# Patient Record
Sex: Female | Born: 1965 | Race: White | Hispanic: No | Marital: Married | Smoking: Current every day smoker
Health system: Southern US, Community
[De-identification: ages and names within clinical notes are randomized; demographics above are authoritative.]

## PROBLEM LIST (undated history)

## (undated) DIAGNOSIS — E079 Disorder of thyroid, unspecified: Secondary | ICD-10-CM

## (undated) DIAGNOSIS — F419 Anxiety disorder, unspecified: Secondary | ICD-10-CM

## (undated) HISTORY — PX: TONSILLECTOMY: SUR1361

## (undated) HISTORY — PX: NASAL SINUS SURGERY: SHX719

---

## 2014-12-02 ENCOUNTER — Telehealth: Payer: Self-pay | Admitting: *Deleted

## 2014-12-02 NOTE — Telephone Encounter (Signed)
error 

## 2016-01-01 ENCOUNTER — Ambulatory Visit
Admission: EM | Admit: 2016-01-01 | Discharge: 2016-01-01 | Disposition: A | Discharge status: Home / Self Care | Payer: BLUE CROSS/BLUE SHIELD | Attending: Family Medicine | Admitting: Family Medicine

## 2016-01-01 ENCOUNTER — Encounter: Payer: Self-pay | Admitting: Gynecology

## 2016-01-01 DIAGNOSIS — J189 Pneumonia, unspecified organism: Secondary | ICD-10-CM

## 2016-01-01 HISTORY — DX: Anxiety disorder, unspecified: F41.9

## 2016-01-01 HISTORY — DX: Disorder of thyroid, unspecified: E07.9

## 2016-01-01 LAB — RAPID INFLUENZA A&B ANTIGENS
Influenza A (ARMC): NOT DETECTED
Influenza B (ARMC): NOT DETECTED

## 2016-01-01 MED ORDER — HYDROCOD POLST-CPM POLST ER 10-8 MG/5ML PO SUER: 5.0000 mL | Freq: Every evening | ORAL | Status: AC | PRN

## 2016-01-01 MED ORDER — LEVOFLOXACIN 750 MG PO TABS: 750.0000 mg | ORAL_TABLET | Freq: Every day | ORAL | Status: AC

## 2016-01-01 MED ORDER — ALBUTEROL SULFATE HFA 108 (90 BASE) MCG/ACT IN AERS: 1.0000 | INHALATION_SPRAY | Freq: Four times a day (QID) | RESPIRATORY_TRACT | Status: AC | PRN

## 2016-01-01 NOTE — ED Provider Notes (Signed)
CSN: [id number]     Arrival date & time 01/01/16  1406 History   None    Chief Complaint  Patient presents with  . Influenza   (Consider location/radiation/quality/duration/timing/severity/associated sxs/prior Treatment) Patient is a 50 y.o. female presenting with URI. The history is provided by the patient.  URI Presenting symptoms: congestion, cough, fatigue and fever   Severity:  Moderate Onset quality:  Sudden Duration:  4 days Timing:  Constant Progression:  Worsening Chronicity:  New Relieved by:  Nothing Ineffective treatments:  OTC medications Associated symptoms: headaches   Associated symptoms: no myalgias, no neck pain, no sinus pain, no sneezing and no wheezing   Risk factors: sick contacts (states mom has pneumonia)   Risk factors: not elderly, no chronic cardiac disease, no chronic kidney disease, no chronic respiratory disease, no diabetes mellitus, no immunosuppression, no recent illness and no recent travel     Past Medical History  Diagnosis Date  . Thyroid disease   . Anxiety    Past Surgical History  Procedure Laterality Date  . Tonsillectomy    . Nasal sinus surgery     No family history on file. Social History  Substance Use Topics  . Smoking status: Current Every Day Smoker -- 0.50 packs/day  . Smokeless tobacco: None  . Alcohol Use: Yes   OB History    No data available     Review of Systems  Constitutional: Positive for fever and fatigue.  HENT: Positive for congestion. Negative for sneezing.   Respiratory: Positive for cough. Negative for wheezing.   Musculoskeletal: Negative for myalgias and neck pain.  Neurological: Positive for headaches.    Allergies  Sulfa antibiotics  Home Medications   Prior to Admission medications   Medication Sig Start Date End Date Taking? Authorizing Provider  citalopram (CELEXA) 20 MG tablet Take 20 mg by mouth daily.   Yes Historical Provider, MD  levothyroxine (SYNTHROID, LEVOTHROID) 75 MCG tablet  Take 75 mcg by mouth daily before breakfast.   Yes Historical Provider, MD  levothyroxine (SYNTHROID, LEVOTHROID) 88 MCG tablet Take 88 mcg by mouth daily before breakfast.   Yes Historical Provider, MD  LORazepam (ATIVAN) 0.5 MG tablet Take 0.5 mg by mouth every 8 (eight) hours.   Yes Historical Provider, MD  albuterol (PROVENTIL HFA;VENTOLIN HFA) 108 (90 Base) MCG/ACT inhaler Inhale 1-2 puffs into the lungs every 6 (six) hours as needed for wheezing or shortness of breath. 01/01/16   Payton Mccallum, MD  chlorpheniramine-HYDROcodone (TUSSIONEX PENNKINETIC ER) 10-8 MG/5ML SUER Take 5 mLs by mouth at bedtime as needed for cough. 01/01/16   Payton Mccallum, MD  levofloxacin (LEVAQUIN) 750 MG tablet Take 1 tablet (750 mg total) by mouth daily. 01/01/16   Payton Mccallum, MD   Meds Ordered and Administered this Visit  Medications - No data to display  BP 131/68 mmHg  Pulse 88  Temp(Src) 99.9 F (37.7 C) (Oral)  Resp 16  Ht  (1.753 m)  Wt 160 lb (72.576 kg)  BMI 23.62 kg/m2  SpO2 98%  LMP 12/18/2015 No data found.   Physical Exam  Constitutional: She appears well-developed and well-nourished. No distress.  HENT:  Head: Normocephalic and atraumatic.  Right Ear: Tympanic membrane, external ear and ear canal normal.  Left Ear: Tympanic membrane, external ear and ear canal normal.  Nose: Mucosal edema and rhinorrhea present. No nose lacerations, sinus tenderness, nasal deformity, septal deviation or nasal septal hematoma. No epistaxis.  No foreign bodies.  Mouth/Throat: Uvula is midline and  mucous membranes are normal. Posterior oropharyngeal erythema present. No oropharyngeal exudate.  Eyes: Conjunctivae and EOM are normal. Pupils are equal, round, and reactive to light. Right eye exhibits no discharge. Left eye exhibits no discharge. No scleral icterus.  Neck: Normal range of motion. Neck supple. No thyromegaly present.  Cardiovascular: Normal rate, regular rhythm and normal heart sounds.    Pulmonary/Chest: Effort normal. No respiratory distress. She has no wheezes. She has rales (left base).  Lymphadenopathy:    She has no cervical adenopathy.  Neurological: She is alert.  Skin: She is not diaphoretic.  Nursing note and vitals reviewed.   ED Course  Procedures (including critical care time)  Labs Review Labs Reviewed  RAPID INFLUENZA A&B ANTIGENS [facility] ONLY)    Imaging Review No results found.   Visual Acuity Review  Right Eye Distance:   Left Eye Distance:   Bilateral Distance:    Right Eye Near:   Left Eye Near:    Bilateral Near:         MDM   1. Community acquired pneumonia    Discharge Medication List as of 01/01/2016  5:07 PM    START taking these medications   Details  albuterol (PROVENTIL HFA;VENTOLIN HFA) 108 (90 Base) MCG/ACT inhaler Inhale 1-2 puffs into the lungs every 6 (six) hours as needed for wheezing or shortness of breath., Starting 01/01/2016, Until Discontinued, Print    chlorpheniramine-HYDROcodone (TUSSIONEX PENNKINETIC ER) 10-8 MG/5ML SUER Take 5 mLs by mouth at bedtime as needed for cough., Starting 01/01/2016, Until Discontinued, Normal    levofloxacin (LEVAQUIN) 750 MG tablet Take 1 tablet (750 mg total) by mouth daily., Starting 01/01/2016, Until Discontinued, Print       1. Lab results and diagnosis reviewed with patient 2. rx as per orders above; reviewed possible side effects, interactions, risks and benefits  3. Recommend supportive treatment with rest, increased fluids, otc analgesics 4. Follow-up prn if symptoms worsen or don't improve    Payton Mccallum, MD 01/01/16 1900

## 2016-01-01 NOTE — ED Notes (Signed)
Patient c/o fever at home x 4 days of 103.1. Patient stated body ace and headache.

## 2016-01-22 ENCOUNTER — Ambulatory Visit
Admission: RE | Admit: 2016-01-22 | Discharge: 2016-01-22 | Disposition: A | Discharge status: Home / Self Care | Payer: BLUE CROSS/BLUE SHIELD | Source: Ambulatory Visit | Attending: Family Medicine | Admitting: Family Medicine

## 2016-01-22 ENCOUNTER — Other Ambulatory Visit: Payer: Self-pay | Admitting: Family Medicine

## 2016-01-22 DIAGNOSIS — R05 Cough: Secondary | ICD-10-CM

## 2016-01-22 DIAGNOSIS — R918 Other nonspecific abnormal finding of lung field: Secondary | ICD-10-CM | POA: Insufficient documentation

## 2016-01-22 DIAGNOSIS — R059 Cough, unspecified: Secondary | ICD-10-CM

## 2016-02-10 ENCOUNTER — Ambulatory Visit
Admission: RE | Admit: 2016-02-10 | Discharge: 2016-02-10 | Disposition: A | Discharge status: Home / Self Care | Payer: BLUE CROSS/BLUE SHIELD | Source: Ambulatory Visit | Attending: Family Medicine | Admitting: Family Medicine

## 2016-02-10 ENCOUNTER — Other Ambulatory Visit: Payer: Self-pay | Admitting: Family Medicine

## 2016-02-10 DIAGNOSIS — Z09 Encounter for follow-up examination after completed treatment for conditions other than malignant neoplasm: Secondary | ICD-10-CM | POA: Diagnosis present

## 2016-02-10 DIAGNOSIS — J189 Pneumonia, unspecified organism: Secondary | ICD-10-CM | POA: Diagnosis present

## 2016-02-10 DIAGNOSIS — Z8701 Personal history of pneumonia (recurrent): Secondary | ICD-10-CM | POA: Insufficient documentation

## 2017-05-27 IMAGING — CR DG CHEST 2V
3 series · 3 of 3 positions shown · non-contrast
Comparison: 01/22/2016

CLINICAL DATA: Followup chest x-ray from 1 month ago

EXAM:
CHEST  2 VIEW

[chest pa]
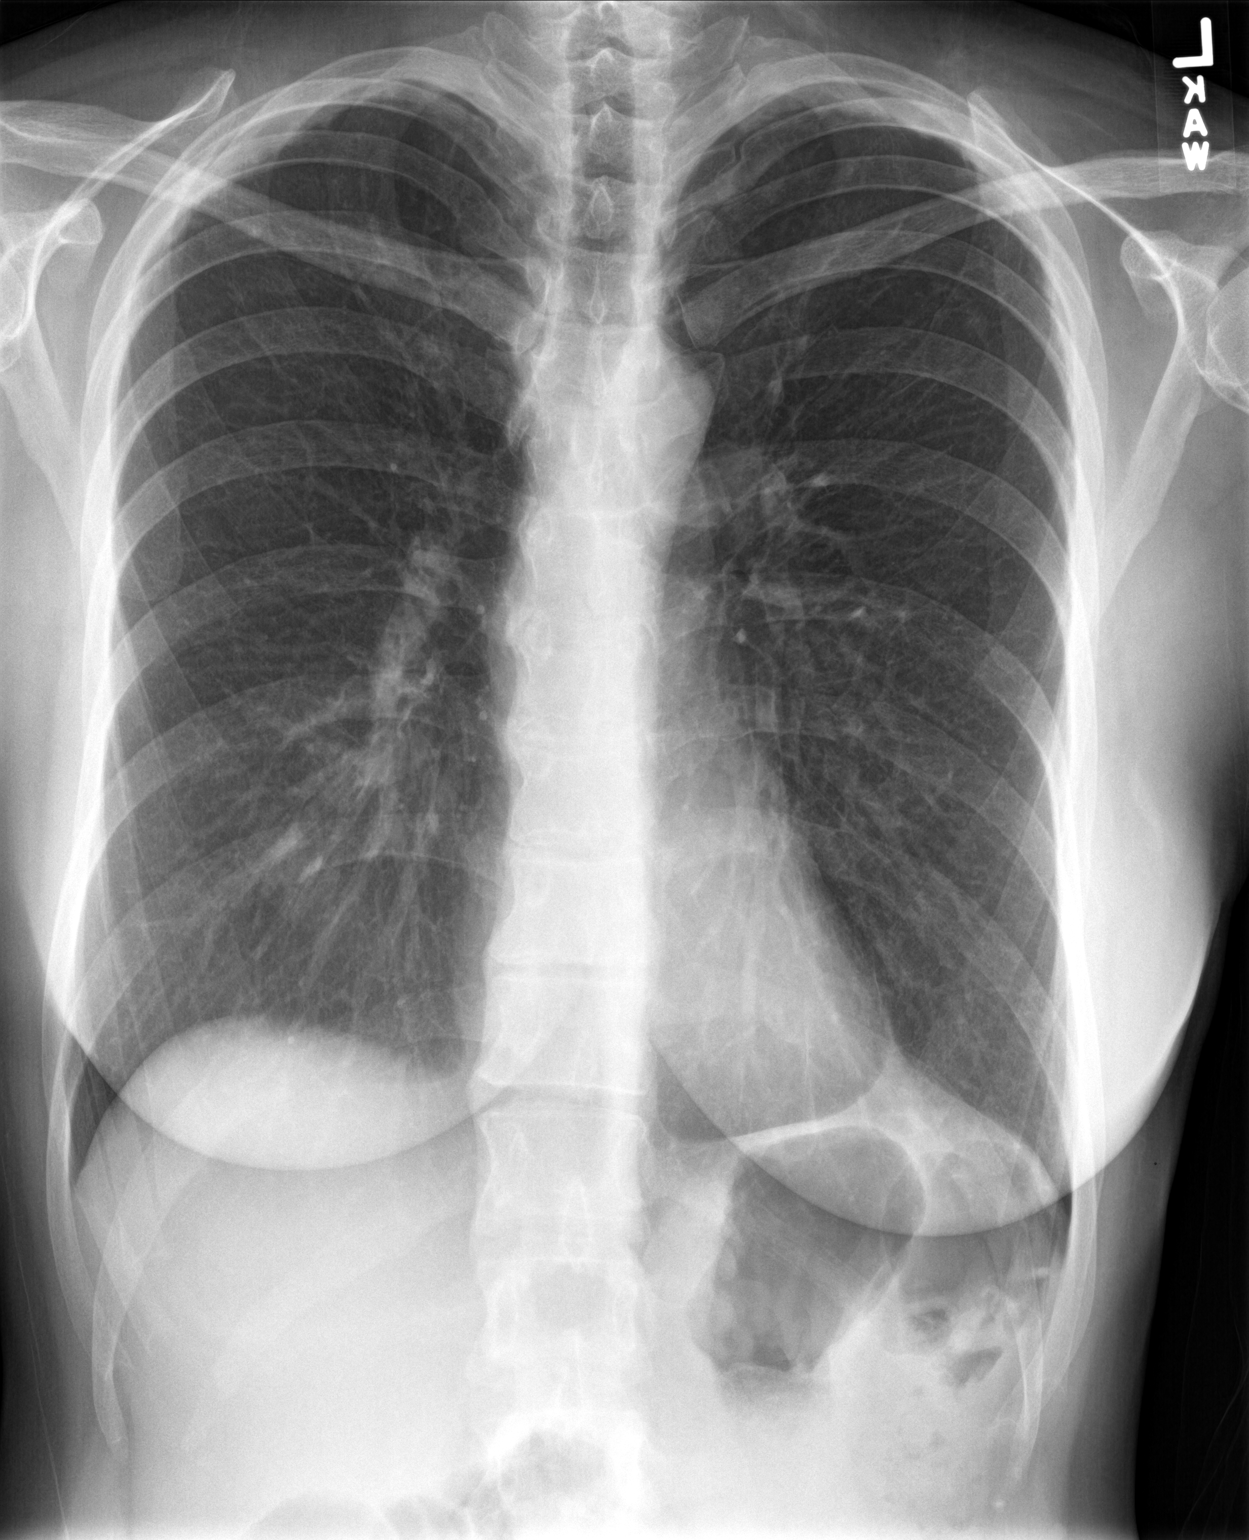

[chest lat (1 of 2)]
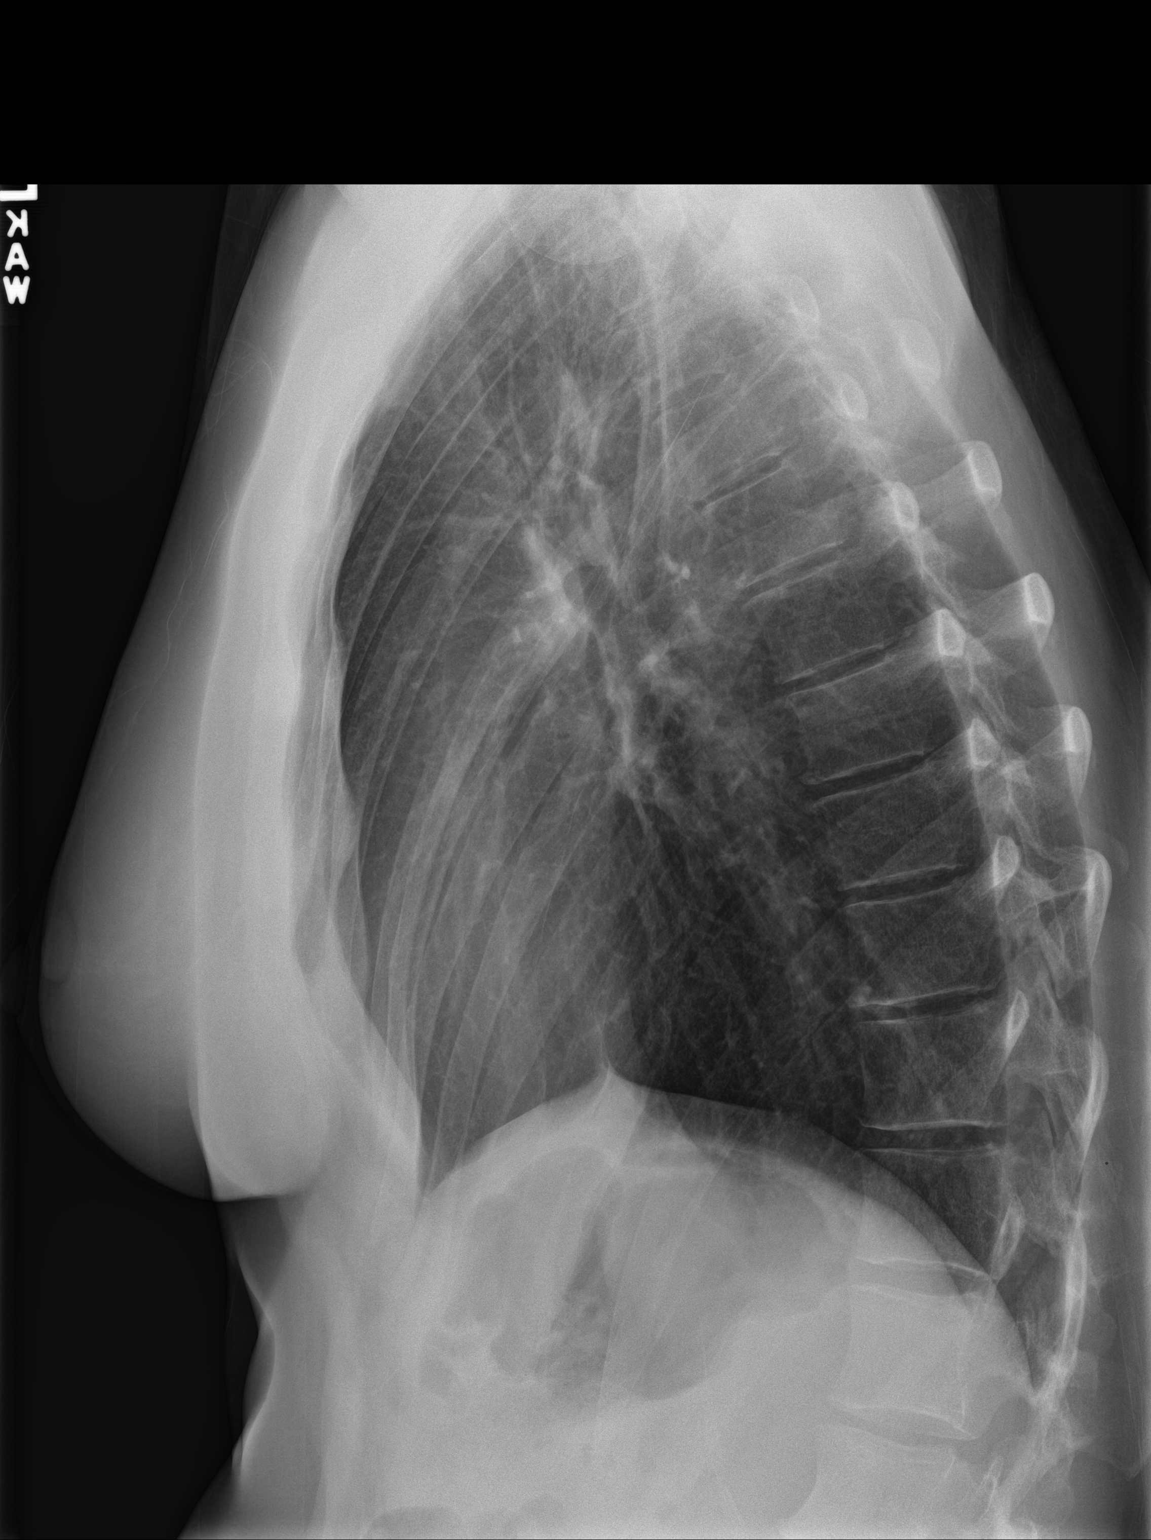

[chest lat (2 of 2)]
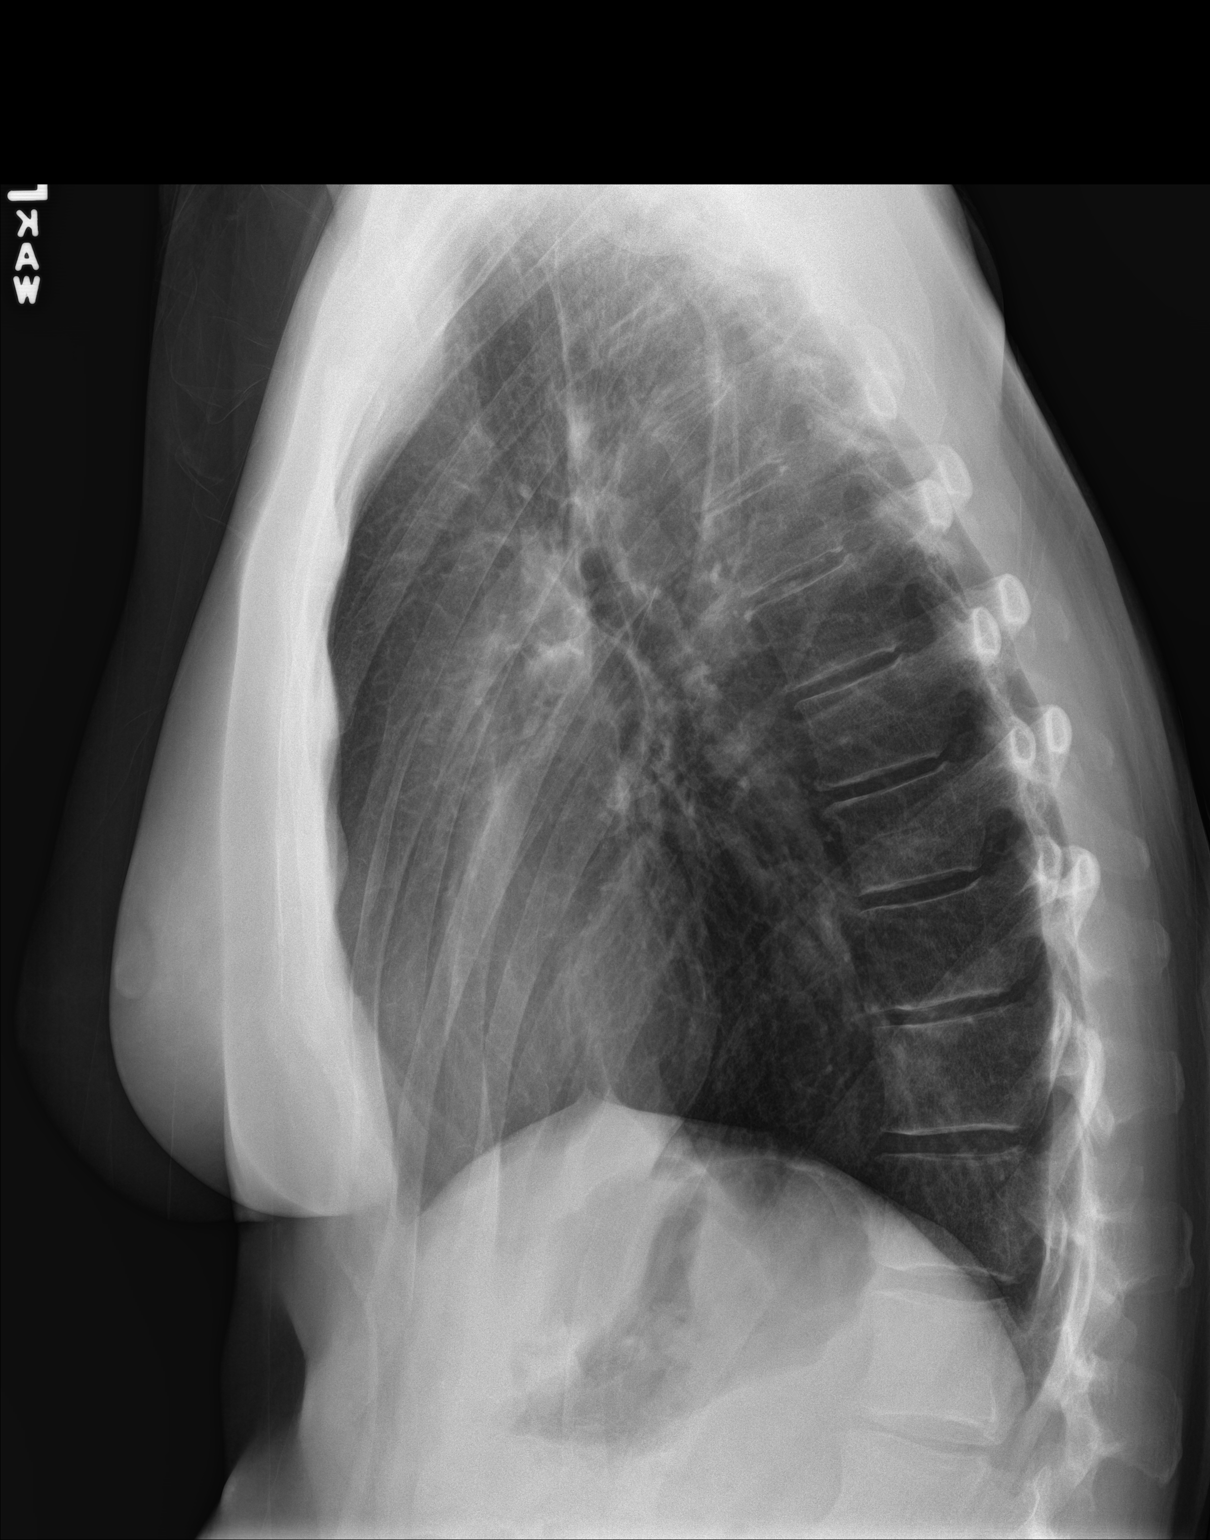

[3 of 3 positions shown; findings below may reference images not displayed]

FINDINGS: The heart size and mediastinal contours are within normal limits.
Both lungs are clear. The visualized skeletal structures are
unremarkable.
IMPRESSION: No active cardiopulmonary disease.

## 2018-10-22 ENCOUNTER — Other Ambulatory Visit: Payer: Self-pay | Admitting: Family Medicine

## 2018-10-22 DIAGNOSIS — Z1231 Encounter for screening mammogram for malignant neoplasm of breast: Secondary | ICD-10-CM

## 2018-10-24 ENCOUNTER — Inpatient Hospital Stay: Admission: RE | Admit: 2018-10-24 | Payer: BLUE CROSS/BLUE SHIELD | Source: Ambulatory Visit
